# Patient Record
Sex: Male | Born: 1960 | Race: White | Hispanic: No | Marital: Married | Smoking: Never smoker
Health system: Southern US, Community
[De-identification: ages and names within clinical notes are randomized; demographics above are authoritative.]

## PROBLEM LIST (undated history)

## (undated) DIAGNOSIS — R42 Dizziness and giddiness: Secondary | ICD-10-CM

## (undated) DIAGNOSIS — Z972 Presence of dental prosthetic device (complete) (partial): Secondary | ICD-10-CM

## (undated) DIAGNOSIS — I10 Essential (primary) hypertension: Secondary | ICD-10-CM

## (undated) DIAGNOSIS — Z87442 Personal history of urinary calculi: Secondary | ICD-10-CM

## (undated) HISTORY — PX: KIDNEY STONE SURGERY: SHX686

## (undated) HISTORY — PX: HERNIA REPAIR: SHX51

## (undated) HISTORY — PX: BACK SURGERY: SHX140

## (undated) HISTORY — PX: KNEE SURGERY: SHX244

---

## 2006-08-03 ENCOUNTER — Ambulatory Visit: Payer: Self-pay | Admitting: Urology

## 2009-06-13 ENCOUNTER — Ambulatory Visit: Payer: Self-pay | Admitting: Urology

## 2010-08-18 ENCOUNTER — Ambulatory Visit: Payer: Self-pay | Admitting: Internal Medicine

## 2010-10-10 ENCOUNTER — Ambulatory Visit: Payer: Self-pay | Admitting: Internal Medicine

## 2013-06-19 ENCOUNTER — Ambulatory Visit: Payer: Self-pay | Admitting: Unknown Physician Specialty

## 2016-04-14 ENCOUNTER — Other Ambulatory Visit: Payer: Self-pay | Admitting: Neurology

## 2016-04-14 DIAGNOSIS — R251 Tremor, unspecified: Secondary | ICD-10-CM

## 2016-04-25 ENCOUNTER — Ambulatory Visit
Admission: RE | Admit: 2016-04-25 | Discharge: 2016-04-25 | Disposition: A | Discharge status: Home / Self Care | Payer: BLUE CROSS/BLUE SHIELD | Source: Ambulatory Visit | Attending: Neurology | Admitting: Neurology

## 2016-04-25 DIAGNOSIS — G250 Essential tremor: Secondary | ICD-10-CM | POA: Insufficient documentation

## 2016-04-25 DIAGNOSIS — R251 Tremor, unspecified: Secondary | ICD-10-CM

## 2018-01-04 IMAGING — MR MR HEAD W/O CM
10 series · 48 of 48 positions shown · non-contrast
Comparison: 10/10/2010 MRI brain.

CLINICAL DATA: Tremors in both hands and arms, worse on the RIGHT.
Started 5 years ago.

EXAM:
MRI HEAD WITHOUT CONTRAST
TECHNIQUE: Multiplanar, multiecho pulse sequences of the brain and surrounding
structures were obtained without intravenous contrast.

[Series 2: T1 · sagittal · 5.0mm · 0.45mm/px · 3 of 27 slices shown (1 of 2)]
[im 1/27]
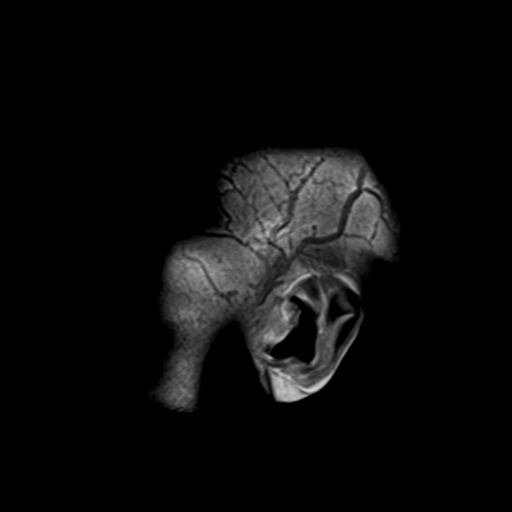
[im 14/27]
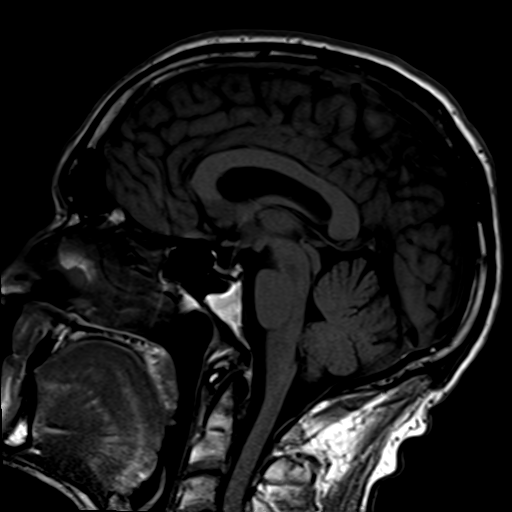
[im 27/27]
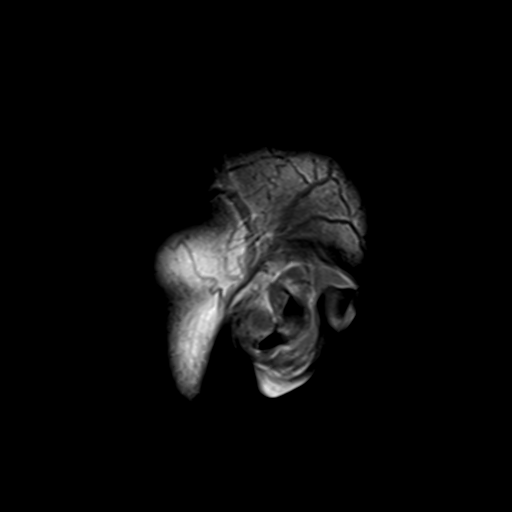

[Series 4: DWI · axial · 3.0mm · 1.80mm/px · z∈[-7,+147]mm · 7 of 55 slices shown (1 of 2)]
[im 1/55]
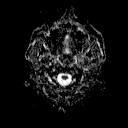
[im 10/55]
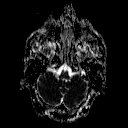
[im 19/55]
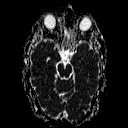
[im 28/55]
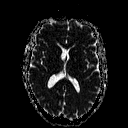
[im 37/55]
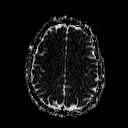
[im 46/55]
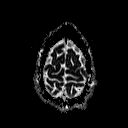
[im 55/55]
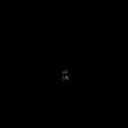

[Series 6: DWI · coronal · 3.0mm · 1.80mm/px · 6 of 47 slices shown (2 of 2)]
[im 1/47]
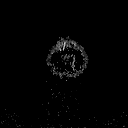
[im 10/47]
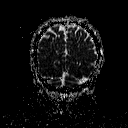
[im 19/47]
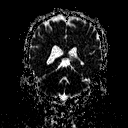
[im 28/47]
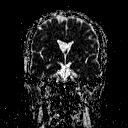
[im 37/47]
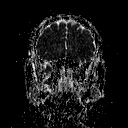
[im 47/47]
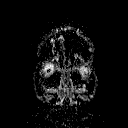

[Series 7: T2 · axial · 5.0mm · 0.60mm/px · z∈[-14,+146]mm · 3 of 27 slices shown (1 of 3)]
[im 1/27]
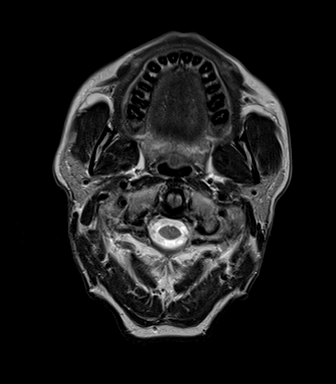
[im 14/27]
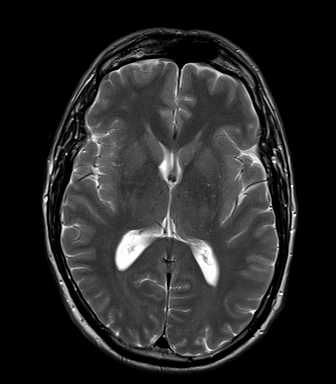
[im 27/27]
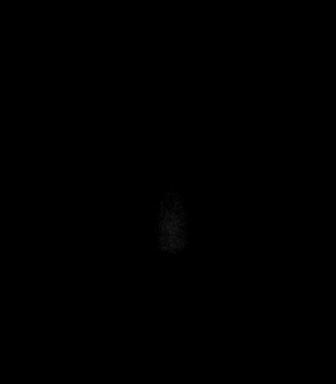

[Series 8: FLAIR · axial · 5.0mm · 0.45mm/px · z∈[-14,+146]mm · 3 of 27 slices shown]
[im 1/27]
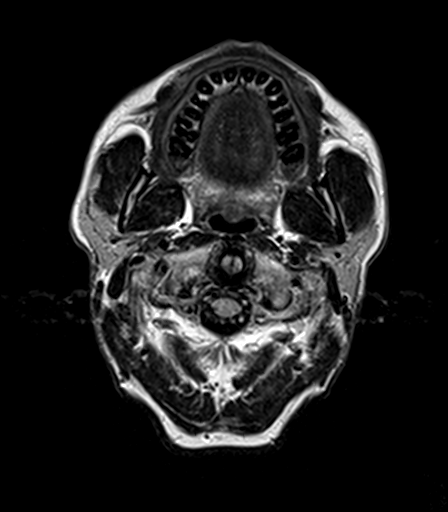
[im 14/27]
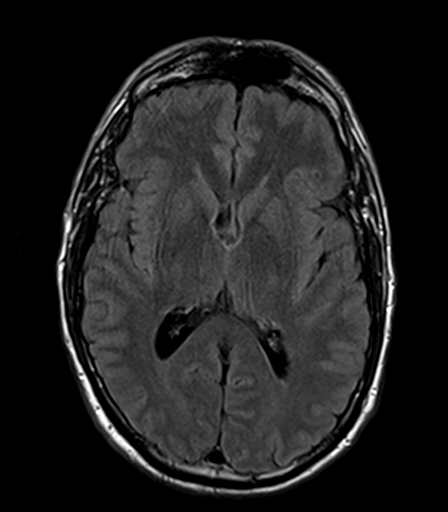
[im 27/27]
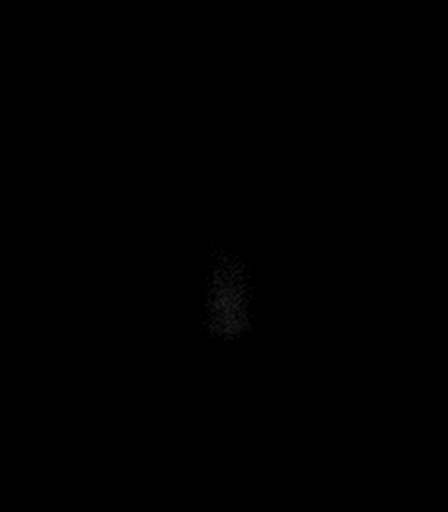

[Series 9: T2 · axial · 5.0mm · 0.45mm/px · z∈[-14,+146]mm · 3 of 27 slices shown (2 of 3)]
[im 1/27]
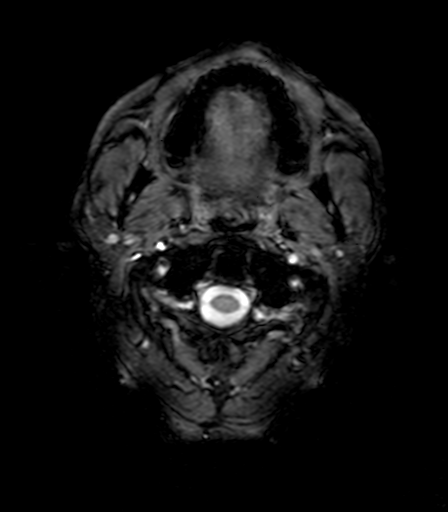
[im 14/27]
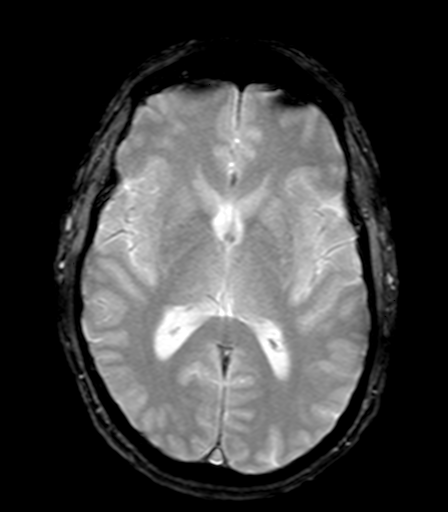
[im 27/27]
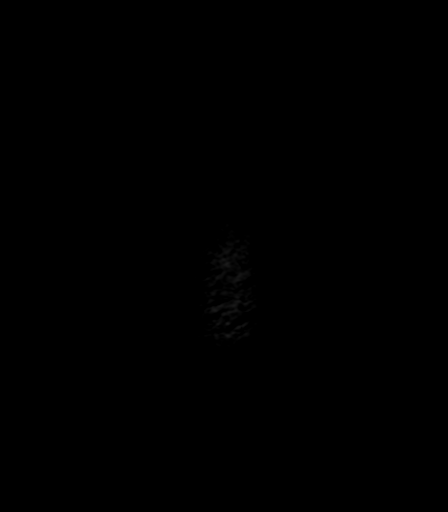

[Series 10: T1 · axial · 3.0mm · 1.00mm/px · z∈[-14,+154]mm · 7 of 60 slices shown (2 of 2)]
[im 1/60]
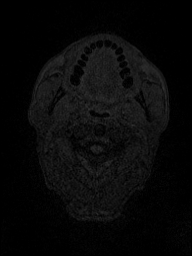
[im 10/60]
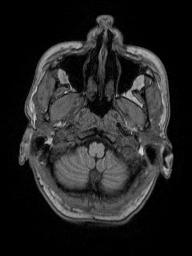
[im 20/60]
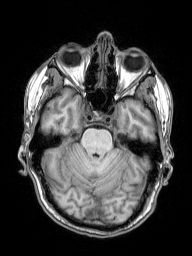
[im 30/60]
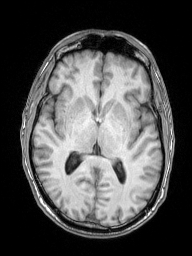
[im 40/60]
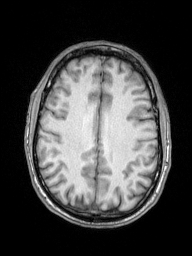
[im 50/60]
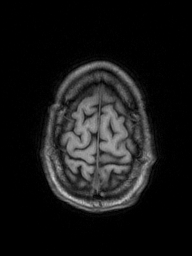
[im 60/60]
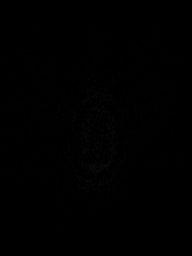

[Series 11: T2 · coronal · 5.0mm · 0.49mm/px · 3 of 29 slices shown (3 of 3)]
[im 1/29]
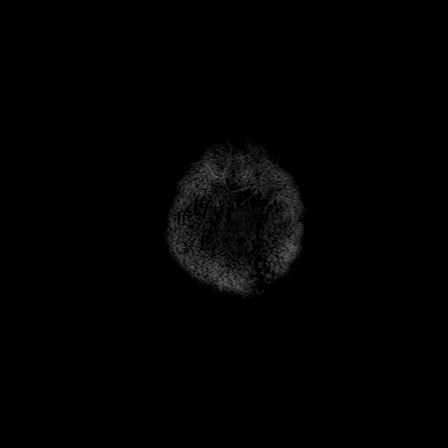
[im 15/29]
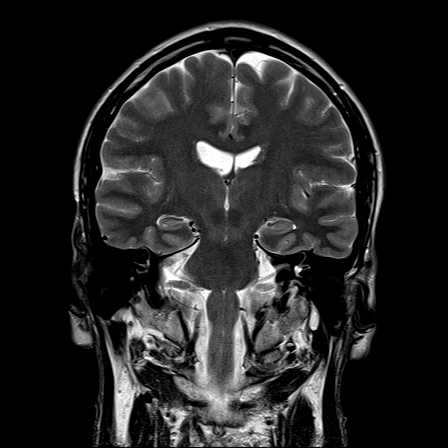
[im 29/29]
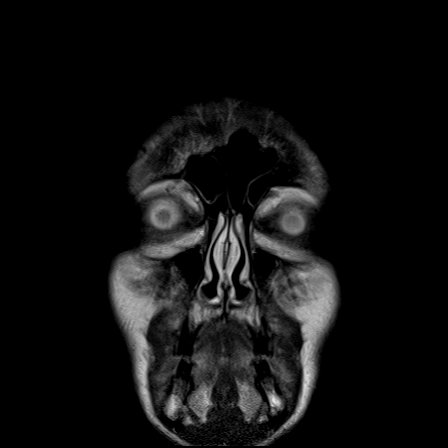

[Series 100: (id) · axial · 3.0mm · 1.80mm/px · z∈[-7,+147]mm · 7 of 55 slices shown]
[im 1/55]
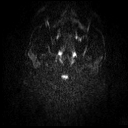
[im 10/55]
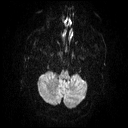
[im 19/55]
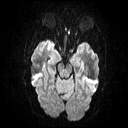
[im 28/55]
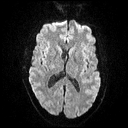
[im 37/55]
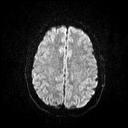
[im 46/55]
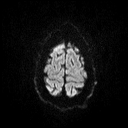
[im 55/55]
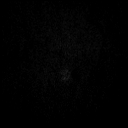

[Series 101: (id) cor · coronal · 3.0mm · 1.80mm/px · 6 of 47 slices shown]
[im 1/47]
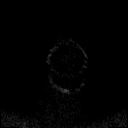
[im 10/47]
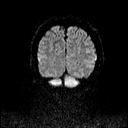
[im 19/47]
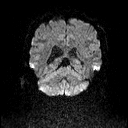
[im 28/47]
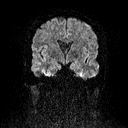
[im 37/47]
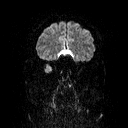
[im 47/47]
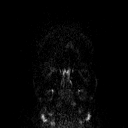

[48 of 48 positions shown; findings below may reference images not displayed]

FINDINGS: No evidence for acute infarction, hemorrhage, mass lesion,
hydrocephalus, or extra-axial fluid. Normal cerebral volume. No
white matter disease.

No midline abnormalities.

Flow voids are maintained throughout the carotid, basilar, and
vertebral arteries. There are no areas of chronic hemorrhage.

Visualized calvarium, skull base, and upper cervical osseous
structures unremarkable. Scalp and extracranial soft tissues,
orbits, sinuses, and mastoids show no acute process.

When compared with the previous study 1421, which was performed
without with contrast, abnormal pachymeningeal enhancement was
present previously. This is nonspecific, but could represent
intracranial hypotension, or a previous infectious/inflammatory
process. The dura is not well visualized on noncontrast imaging
today, but if further investigation desired, consider post infusion
imaging.
IMPRESSION: No acute intracranial findings. No lesion is observed correlate with
patient's symptoms.

Previous post infusion scan from 1421 demonstrated abnormal
pachymeningeal enhancement. Consider follow-up imaging to assess for
interval resolution.

## 2021-07-07 DIAGNOSIS — Z23 Encounter for immunization: Secondary | ICD-10-CM

## 2022-07-13 ENCOUNTER — Ambulatory Visit (LOCAL_COMMUNITY_HEALTH_CENTER): Payer: BLUE CROSS/BLUE SHIELD

## 2022-07-13 DIAGNOSIS — Z719 Counseling, unspecified: Secondary | ICD-10-CM

## 2022-07-13 DIAGNOSIS — Z23 Encounter for immunization: Secondary | ICD-10-CM

## 2022-07-13 NOTE — Progress Notes (Signed)
  Are you feeling sick today? No   Have you ever received a dose of COVID-19 Vaccine? [employer], [place], [place], [place], Other) Yes  If yes, which vaccine and how many doses?   PFIZER, 5   Did you bring the vaccination record card or other documentation?  No   Do you have a health condition or are undergoing treatment that makes you moderately or severely immunocompromised? This would include, but not be limited to: cancer, HIV, organ transplant, immunosuppressive therapy/high-dose corticosteroids, or moderate/severe primary immunodeficiency.  No  Have you received COVID-19 vaccine before or during hematopoietic cell transplant (HCT) or CAR-T-cell therapies? No  Have you ever had an allergic reaction to: (This would include a severe allergic reaction or a reaction that caused hives, swelling, or respiratory distress, including wheezing.) A component of a COVID-19 vaccine or a previous dose of COVID-19 vaccine? No   Have you ever had an allergic reaction to another vaccine (other thanCOVID-19 vaccine) or an injectable medication? (This would include a severe allergic reaction or a reaction that caused hives, swelling, or respiratory distress, including wheezing.)   No    Do you have a history of any of the following:  Myocarditis or Pericarditis No  Dermal fillers:  No  Multisystem Inflammatory Syndrome (MIS-C or MIS-A)? No  COVID-19 disease within the past 3 months? No  Vaccinated with monkeypox vaccine in the last 4 weeks? No  Eligible and administered Fluzone and Comirnaty 12+2023-24, monitored, tolerated well. Verbalized understanding of VIS and NCIR. M.J.ones, LPN.

## 2022-08-12 ENCOUNTER — Ambulatory Visit: Payer: Self-pay

## 2022-08-12 ENCOUNTER — Ambulatory Visit
Admission: EM | Admit: 2022-08-12 | Discharge: 2022-08-12 | Disposition: A | Discharge status: Home / Self Care | Payer: BLUE CROSS/BLUE SHIELD

## 2022-08-12 DIAGNOSIS — T148XXA Other injury of unspecified body region, initial encounter: Secondary | ICD-10-CM

## 2022-08-12 NOTE — Discharge Instructions (Addendum)
Turn to urgent care to have sutures removed after 7 days.

## 2022-08-12 NOTE — ED Provider Notes (Signed)
Thomas Shah    CSN: [id number] Arrival date & time: 08/12/22  1513      History   Chief Complaint Chief Complaint  Patient presents with   Hand Problem    Splinter in right hand. X1 day    HPI Thomas Shah is a 61 y.o. male.   HPI  Presents to urgent care with complaint of splinter in his right hand.  He states that splinter entered his palm and is sticking out the side.  He tried to remove himself but the splinter broke off.  History reviewed. No pertinent past medical history.  There are no problems to display for this patient.   History reviewed. No pertinent surgical history.     Home Medications    Prior to Admission medications   Medication Sig Start Date End Date Taking? Authorizing Provider  Multiple Vitamin (MULTI-VITAMIN) tablet Take 1 tablet by mouth daily.   Yes [provider]  clobetasol ointment (TEMOVATE) 0.05 % Apply twice daily to raised itchy areas until smooth 11/02/19   [provider]    Family History History reviewed. No pertinent family history.  Social History Social History   Tobacco Use   Smoking status: Never   Smokeless tobacco: Never  Substance Use Topics   Alcohol use: Never   Drug use: Never     Allergies   Patient has no known allergies.   Review of Systems Review of Systems   Physical Exam Triage Vital Signs ED Triage Vitals  Enc Vitals Group     BP 08/12/22 1546 (!) 143/99     Pulse Rate 08/12/22 1546 94     Resp 08/12/22 1546 18     Temp 08/12/22 1546 97.8 F (36.6 C)     Temp Source 08/12/22 1546 Oral     SpO2 08/12/22 1546 98 %     Weight 08/12/22 1544 160 lb (72.6 kg)     Height 08/12/22 1544 5\' 11"  (1.803 m)     Head Circumference --      Peak Flow --      Pain Score 08/12/22 1544 1     Pain Loc --      Pain Edu? --      Excl. in GC? --    No data found.  Updated Vital Signs BP (!) 143/99 (BP Location: Right Arm)   Pulse 94   Temp 97.8 F (36.6 C) (Oral)    Resp 18   Ht 5\' 11"  (1.803 m)   Wt 160 lb (72.6 kg)   SpO2 98%   BMI 22.32 kg/m   Visual Acuity Right Eye Distance:   Left Eye Distance:   Bilateral Distance:    Right Eye Near:   Left Eye Near:    Bilateral Near:     Physical Exam Vitals reviewed.  Constitutional:      Appearance: Normal appearance.  Musculoskeletal:       Hands:  Skin:    General: Skin is warm and dry.  Neurological:     General: No focal deficit present.     Mental Status: He is alert and oriented to person, place, and time.  Psychiatric:        Mood and Affect: Mood normal.        Behavior: Behavior normal.      UC Treatments / Results  Labs (all labs ordered are listed, but only abnormal results are displayed) Labs Reviewed - No data to display  EKG   Radiology  No results found.  Procedures Foreign Body Removal  Date/Time: 08/12/2022 5:56 PM  Performed by: Charma Igo, FNP Authorized by: Charma Igo, FNP   Consent:    Consent obtained:  Verbal   Consent given by:  Patient   Risks, benefits, and alternatives were discussed: yes     Risks discussed:  Bleeding, infection, pain and incomplete removal   Alternatives discussed:  No treatment Universal protocol:    Procedure explained and questions answered to patient or proxy's satisfaction: yes     Relevant documents present and verified: yes     Test results available: no     Imaging studies available: no     Required blood products, implants, devices, and special equipment available: no     Site/side marked: no     Immediately prior to procedure, a time out was called: yes     Patient identity confirmed:  Verbally with patient Location:    Location:  Hand   Hand location:  R palm   Depth:  Intradermal   Tendon involvement:  None Pre-procedure details:    Imaging:  None   Preparation: Patient was prepped and draped in usual sterile fashion   Anesthesia:    Anesthesia method:  Local infiltration   Local  anesthetic:  Lidocaine 1% WITH epi Procedure type:    Procedure complexity:  Simple Procedure details:    Incision length:  3 mm   Localization method:  Probed   Dissection of underlying tissues: no     Bloodless field: no     Removal mechanism:  Hemostat   Foreign bodies recovered:  1   Description:  Wedge shaped splinter, 3 cm's in length, width 2 x 3 on proximal end, 1 x 2 on distal end   Intact foreign body removal: yes   Post-procedure details:    Neurovascular status: intact     Confirmation:  No additional foreign bodies on visualization   Skin closure:  Sutures   Suture size:  3-0   Suture material:  Prolene   Suture technique:  Simple interrupted   Dressing:  Adhesive bandage   Procedure completion:  Tolerated Comments:     Instructed patient to remove sutures after 7 days.  (including critical care time)  Medications Ordered in UC Medications - No data to display  Initial Impression / Assessment and Plan / UC Course  I have reviewed the triage vital signs and the nursing notes.  Pertinent labs & imaging results that were available during my care of the patient were reviewed by me and considered in my medical decision making (see chart for details).   See procedure note   Final Clinical Impressions(s) / UC Diagnoses   Final diagnoses:  None   Discharge Instructions   None    ED Prescriptions   None    PDMP not reviewed this encounter.   Charma Igo, [place] 08/12/22 1759

## 2022-08-12 NOTE — ED Triage Notes (Signed)
Pt states that he has a splinter in his right hand. X1 day

## 2022-11-18 ENCOUNTER — Ambulatory Visit
Admission: RE | Admit: 2022-11-18 | Discharge: 2022-11-18 | Disposition: A | Discharge status: Home / Self Care | Payer: 59 | Source: Ambulatory Visit | Attending: Internal Medicine | Admitting: Internal Medicine

## 2022-11-18 ENCOUNTER — Other Ambulatory Visit: Payer: Self-pay | Admitting: Internal Medicine

## 2022-11-18 DIAGNOSIS — R600 Localized edema: Secondary | ICD-10-CM | POA: Diagnosis not present

## 2022-11-30 DIAGNOSIS — M13861 Other specified arthritis, right knee: Secondary | ICD-10-CM | POA: Diagnosis not present

## 2022-11-30 DIAGNOSIS — M13852 Other specified arthritis, left hip: Secondary | ICD-10-CM | POA: Diagnosis not present

## 2022-11-30 DIAGNOSIS — M25461 Effusion, right knee: Secondary | ICD-10-CM | POA: Diagnosis not present

## 2022-11-30 DIAGNOSIS — M25561 Pain in right knee: Secondary | ICD-10-CM | POA: Diagnosis not present

## 2022-12-07 DIAGNOSIS — M5116 Intervertebral disc disorders with radiculopathy, lumbar region: Secondary | ICD-10-CM | POA: Diagnosis not present

## 2022-12-07 DIAGNOSIS — I10 Essential (primary) hypertension: Secondary | ICD-10-CM | POA: Diagnosis not present

## 2022-12-25 DIAGNOSIS — M1711 Unilateral primary osteoarthritis, right knee: Secondary | ICD-10-CM | POA: Diagnosis not present

## 2023-01-20 ENCOUNTER — Ambulatory Visit
Admission: RE | Admit: 2023-01-20 | Discharge: 2023-01-20 | Disposition: A | Discharge status: Home / Self Care | Payer: 59 | Source: Ambulatory Visit | Attending: Orthopedic Surgery | Admitting: Orthopedic Surgery

## 2023-01-20 ENCOUNTER — Other Ambulatory Visit: Payer: Self-pay | Admitting: Orthopedic Surgery

## 2023-01-20 DIAGNOSIS — M2391 Unspecified internal derangement of right knee: Secondary | ICD-10-CM

## 2023-01-25 ENCOUNTER — Other Ambulatory Visit: Payer: Self-pay | Admitting: Orthopedic Surgery

## 2023-01-25 ENCOUNTER — Encounter: Payer: Self-pay | Admitting: Orthopedic Surgery

## 2023-01-26 ENCOUNTER — Other Ambulatory Visit: Payer: Self-pay | Admitting: Orthopedic Surgery

## 2023-01-26 NOTE — Anesthesia Preprocedure Evaluation (Addendum)
Anesthesia Evaluation  Patient identified by MRN, date of birth, ID band Patient awake    Reviewed: Allergy & Precautions, H&P , NPO status , Patient's Chart, lab work & pertinent test results  Airway Mallampati: II  TM Distance: >3 FB Neck ROM: full    Dental  (+) Implants   Pulmonary neg pulmonary ROS   Pulmonary exam normal        Cardiovascular hypertension, Pt. on medications Normal cardiovascular exam     Neuro/Psych Hx vertigo  negative neurological ROS  negative psych ROS   GI/Hepatic negative GI ROS, Neg liver ROS,,,  Endo/Other  negative endocrine ROS    Renal/GU Hx kidney stones      Musculoskeletal   Abdominal Normal abdominal exam  (+)   Peds  Hematology negative hematology ROS (+)   Anesthesia Other Findings History of kidney stones  Hypertension Dental bridge present  Vertigo    Reproductive/Obstetrics negative OB ROS                              Anesthesia Physical Anesthesia Plan  ASA: 2  Anesthesia Plan: General LMA   Post-op Pain Management: Tylenol PO (pre-op) and Toradol IV (intra-op)   Induction: Intravenous  PONV Risk Score and Plan: Dexamethasone, Ondansetron, Midazolam and Treatment may vary due to age or medical condition  Airway Management Planned: Oral ETT  Additional Equipment:   Intra-op Plan:   Post-operative Plan: Extubation in OR  Informed Consent: I have reviewed the patients History and Physical, chart, labs and discussed the procedure including the risks, benefits and alternatives for the proposed anesthesia with the patient or authorized representative who has indicated his/her understanding and acceptance.     Dental Advisory Given  Plan Discussed with: Anesthesiologist, CRNA and Surgeon  Anesthesia Plan Comments:          Anesthesia Quick Evaluation

## 2023-01-29 ENCOUNTER — Ambulatory Visit: Payer: 59 | Admitting: Anesthesiology

## 2023-01-29 ENCOUNTER — Other Ambulatory Visit: Payer: Self-pay

## 2023-01-29 ENCOUNTER — Encounter: Payer: Self-pay | Admitting: Orthopedic Surgery

## 2023-01-29 ENCOUNTER — Encounter
Admission: RE | Disposition: A | Discharge status: Home / Self Care | Payer: Self-pay | Source: Home / Self Care | Attending: Orthopedic Surgery

## 2023-01-29 ENCOUNTER — Ambulatory Visit
Admission: RE | Admit: 2023-01-29 | Discharge: 2023-01-29 | Disposition: A | Discharge status: Home / Self Care | Payer: 59 | Attending: Orthopedic Surgery | Admitting: Orthopedic Surgery

## 2023-01-29 DIAGNOSIS — M2341 Loose body in knee, right knee: Secondary | ICD-10-CM | POA: Insufficient documentation

## 2023-01-29 DIAGNOSIS — M24661 Ankylosis, right knee: Secondary | ICD-10-CM | POA: Insufficient documentation

## 2023-01-29 DIAGNOSIS — I10 Essential (primary) hypertension: Secondary | ICD-10-CM | POA: Diagnosis not present

## 2023-01-29 DIAGNOSIS — M6751 Plica syndrome, right knee: Secondary | ICD-10-CM | POA: Diagnosis not present

## 2023-01-29 DIAGNOSIS — Z87442 Personal history of urinary calculi: Secondary | ICD-10-CM | POA: Insufficient documentation

## 2023-01-29 HISTORY — DX: Presence of dental prosthetic device (complete) (partial): Z97.2

## 2023-01-29 HISTORY — DX: Essential (primary) hypertension: I10

## 2023-01-29 HISTORY — DX: Personal history of urinary calculi: Z87.442

## 2023-01-29 HISTORY — DX: Dizziness and giddiness: R42

## 2023-01-29 HISTORY — PX: LYSIS OF ADHESION: SHX5961

## 2023-01-29 SURGERY — LAPAROTOMY, FOR LYSIS OF ADHESIONS
Anesthesia: General | Site: Knee | Laterality: Right

## 2023-01-29 MED ORDER — MIDAZOLAM HCL 5 MG/5ML IJ SOLN
INTRAMUSCULAR | Status: DC | PRN
  Administered 2023-01-29: 2 mg via INTRAVENOUS

## 2023-01-29 MED ORDER — LIDOCAINE-EPINEPHRINE 1 %-1:100000 IJ SOLN
INTRAMUSCULAR | Status: DC | PRN
  Administered 2023-01-29: 3 mL via INTRAMUSCULAR
  Administered 2023-01-29: 4 mL via INTRAMUSCULAR

## 2023-01-29 MED ORDER — HYDROCODONE-ACETAMINOPHEN 5-325 MG PO TABS: 1.0000 | ORAL_TABLET | ORAL | 0 refills | Status: DC | PRN

## 2023-01-29 MED ORDER — LACTATED RINGERS IV SOLN: INTRAVENOUS | Status: DC

## 2023-01-29 MED ORDER — DEXAMETHASONE SODIUM PHOSPHATE 4 MG/ML IJ SOLN
INTRAMUSCULAR | Status: DC | PRN
  Administered 2023-01-29: 4 mg via INTRAVENOUS

## 2023-01-29 MED ORDER — ACETAMINOPHEN 500 MG PO TABS: 1000.0000 mg | ORAL_TABLET | Freq: Three times a day (TID) | ORAL | 2 refills | Status: DC

## 2023-01-29 MED ORDER — TRIAMCINOLONE ACETONIDE 40 MG/ML IJ SUSP
INTRAMUSCULAR | Status: DC | PRN
  Administered 2023-01-29: 40 mg via INTRA_ARTICULAR

## 2023-01-29 MED ORDER — ASPIRIN 325 MG PO TBEC: 325.0000 mg | DELAYED_RELEASE_TABLET | Freq: Every day | ORAL | 0 refills | Status: AC

## 2023-01-29 MED ORDER — IBUPROFEN 200 MG PO TABS: 200.0000 mg | ORAL_TABLET | Freq: Four times a day (QID) | ORAL | Status: DC | PRN

## 2023-01-29 MED ORDER — KETOROLAC TROMETHAMINE 15 MG/ML IJ SOLN
15.0000 mg | Freq: Once | INTRAMUSCULAR | Status: AC
  Administered 2023-01-29: 15 mg via INTRAVENOUS

## 2023-01-29 MED ORDER — OXYCODONE HCL 5 MG PO TABS
5.0000 mg | ORAL_TABLET | Freq: Once | ORAL | Status: AC | PRN
  Administered 2023-01-29: 5 mg via ORAL

## 2023-01-29 MED ORDER — ONDANSETRON HCL 4 MG/2ML IJ SOLN
INTRAMUSCULAR | Status: DC | PRN
  Administered 2023-01-29: 4 mg via INTRAVENOUS

## 2023-01-29 MED ORDER — DEXMEDETOMIDINE HCL IN NACL 200 MCG/50ML IV SOLN
INTRAVENOUS | Status: DC | PRN
  Administered 2023-01-29 (×5): 4 ug via INTRAVENOUS

## 2023-01-29 MED ORDER — ACETAMINOPHEN 10 MG/ML IV SOLN
1000.0000 mg | Freq: Once | INTRAVENOUS | Status: AC
  Administered 2023-01-29: 1000 mg via INTRAVENOUS

## 2023-01-29 MED ORDER — FENTANYL CITRATE PF 50 MCG/ML IJ SOSY
25.0000 ug | PREFILLED_SYRINGE | INTRAMUSCULAR | Status: DC | PRN
  Administered 2023-01-29: 25 ug via INTRAVENOUS
  Administered 2023-01-29: 50 ug via INTRAVENOUS

## 2023-01-29 MED ORDER — IBUPROFEN 100 MG/5ML PO SUSP: 200.0000 mg | Freq: Four times a day (QID) | ORAL | Status: DC | PRN

## 2023-01-29 MED ORDER — PROPOFOL 10 MG/ML IV BOLUS
INTRAVENOUS | Status: DC | PRN
  Administered 2023-01-29: 150 mg via INTRAVENOUS

## 2023-01-29 MED ORDER — CEFAZOLIN SODIUM-DEXTROSE 2-4 GM/100ML-% IV SOLN
2.0000 g | INTRAVENOUS | Status: AC
  Administered 2023-01-29: 2 g via INTRAVENOUS

## 2023-01-29 MED ORDER — FENTANYL CITRATE (PF) 100 MCG/2ML IJ SOLN
INTRAMUSCULAR | Status: DC | PRN
  Administered 2023-01-29 (×3): 50 ug via INTRAVENOUS

## 2023-01-29 MED ORDER — LACTATED RINGERS IR SOLN
Status: DC | PRN
  Administered 2023-01-29: 12000 mL

## 2023-01-29 MED ORDER — LIDOCAINE HCL (CARDIAC) PF 100 MG/5ML IV SOSY
PREFILLED_SYRINGE | INTRAVENOUS | Status: DC | PRN
  Administered 2023-01-29: 80 mg via INTRATRACHEAL

## 2023-01-29 MED ORDER — OXYCODONE HCL 5 MG/5ML PO SOLN: 5.0000 mg | Freq: Once | ORAL | Status: AC | PRN

## 2023-01-29 MED ORDER — ONDANSETRON 4 MG PO TBDP: 4.0000 mg | ORAL_TABLET | Freq: Three times a day (TID) | ORAL | 0 refills | Status: DC | PRN

## 2023-01-29 SURGICAL SUPPLY — 37 items
ADPR IRR PORT MULTIBAG TUBE (MISCELLANEOUS) ×2
APL PRP STRL LF DISP 70% ISPRP (MISCELLANEOUS) ×1
BLADE SHAVER 4.5X7 STR FR (MISCELLANEOUS) ×1 IMPLANT
BLADE SURG SZ11 CARB STEEL (BLADE) ×1 IMPLANT
BNDG ADH 5X4 AIR PERM ELC (GAUZE/BANDAGES/DRESSINGS) ×1
BNDG COHESIVE 4X5 WHT NS (GAUZE/BANDAGES/DRESSINGS) ×1 IMPLANT
CHLORAPREP W/TINT 26 (MISCELLANEOUS) ×1 IMPLANT
COOLER POLAR GLACIER W/PUMP (MISCELLANEOUS) ×1 IMPLANT
COVER LIGHT HANDLE UNIVERSAL (MISCELLANEOUS) ×2 IMPLANT
CUFF TOURN SGL QUICK 30 (TOURNIQUET CUFF)
CUFF TRNQT CYL 30X4X21-28X (TOURNIQUET CUFF) IMPLANT
DRAPE EXTREMITY T 121X128X90 (DISPOSABLE) ×1 IMPLANT
DRAPE IMP U-DRAPE 54X76 (DRAPES) ×1 IMPLANT
GAUZE SPONGE 4X4 12PLY STRL (GAUZE/BANDAGES/DRESSINGS) ×1 IMPLANT
GLOVE SURG ENC MOIS LTX SZ7.5 (GLOVE) ×1 IMPLANT
GLOVE SURG UNDER LTX SZ8 (GLOVE) ×1 IMPLANT
GOWN STRL REIN 2XL XLG LVL4 (GOWN DISPOSABLE) ×1 IMPLANT
GOWN STRL REUS W/TWL LRG LVL3 (GOWN DISPOSABLE) ×1 IMPLANT
IV LACTATED RINGER IRRG 3000ML (IV SOLUTION) ×4
IV LR IRRIG 3000ML ARTHROMATIC (IV SOLUTION) ×4 IMPLANT
KIT TURNOVER KIT A (KITS) ×1 IMPLANT
MANIFOLD NEPTUNE II (INSTRUMENTS) ×1 IMPLANT
MAT ABSORB  FLUID 56X50 GRAY (MISCELLANEOUS) ×1
MAT ABSORB FLUID 56X50 GRAY (MISCELLANEOUS) ×1 IMPLANT
PACK ARTHROSCOPY KNEE (MISCELLANEOUS) ×1 IMPLANT
PAD ABD DERMACEA PRESS 5X9 (GAUZE/BANDAGES/DRESSINGS) ×1 IMPLANT
PAD WRAPON POLAR KNEE (MISCELLANEOUS) ×1 IMPLANT
PADDING CAST BLEND 6X4 STRL (MISCELLANEOUS) ×1 IMPLANT
SET Y ADAPTER MULIT-BAG IRRIG (MISCELLANEOUS) ×2 IMPLANT
SUT ETHILON 3-0 FS-10 30 BLK (SUTURE) ×1
SUTURE EHLN 3-0 FS-10 30 BLK (SUTURE) ×1 IMPLANT
SYR 5ML LL (SYRINGE) ×1 IMPLANT
TOWEL OR 17X26 4PK STRL BLUE (TOWEL DISPOSABLE) ×2 IMPLANT
TUBING INFLOW SET DBFLO PUMP (TUBING) ×1 IMPLANT
TUBING OUTFLOW SET DBLFO PUMP (TUBING) ×1 IMPLANT
WAND WEREWOLF FLOW 90D (MISCELLANEOUS) ×1 IMPLANT
WRAPON POLAR PAD KNEE (MISCELLANEOUS) ×1

## 2023-01-29 NOTE — Transfer of Care (Signed)
Immediate Anesthesia Transfer of Care Note  Patient: Thomas Shah  Procedure(s) Performed: Right knee arthroscopic lysis of adhesions, partial synovectomy, and manipulation under anesthesia with corticosteroid injection (Right: Knee)  Patient Location: PACU  Anesthesia Type: General LMA  Level of Consciousness: awake, alert  and patient cooperative  Airway and Oxygen Therapy: Patient Spontanous Breathing and Patient connected to supplemental oxygen  Post-op Assessment: Post-op Vital signs reviewed, Patient's Cardiovascular Status Stable, Respiratory Function Stable, Patent Airway and No signs of Nausea or vomiting  Post-op Vital Signs: Reviewed and stable  Complications: No notable events documented.

## 2023-01-29 NOTE — Discharge Instructions (Addendum)
Arthroscopic Knee Surgery   Post-Op Instructions   1. Bracing or crutches: Crutches will be provided at the time of discharge from the surgery center if you do not already have them.   2. Ice: You may be provided with a device [facility]) that allows you to ice the affected area effectively. Otherwise you can ice manually.    3. Driving:  Plan on not driving for at least one week. Please note that you are advised NOT to drive while taking narcotic pain medications as you may be impaired and unsafe to drive.   4. Activity: Ankle pumps several times an hour while awake to prevent blood clots. Weight bearing: as tolerated. Use crutches for as needed (usually ~1 week or less) until pain allows you to ambulate without a limp. Bending and straightening the knee is unlimited. Elevate knee above heart level as much as possible for one week. Avoid standing more than 5 minutes (consecutively) for the first week.  Avoid long distance travel for 2 weeks.  - Perform heel slides with towel starting the day after surgery to maximum amount of knee flexion (knee bending) for 5-10 minutes multiple times throughout the day to avoid stiffness. - Resting position should be leg straight with pillow/bump under heel so nothing is under the knee. You can work on tightening the quadriceps muscle and holding for 10-15s multiple times throughout the day.    5. Medications:  - You have been provided a prescription for narcotic pain medicine. After surgery, take 1-2 narcotic tablets every 4 hours if needed for severe pain.  - You may take up to 3000mg /day of tylenol (acetaminophen). You can take 1000mg  3x/day. Please check your narcotic. If you have acetaminophen in your narcotic (each tablet will be 325mg ), be careful not to exceed a total of 3000mg /day of acetaminophen.  - A prescription for anti-nausea medication will be provided in case the narcotic medicine causes nausea - take 1 tablet every 6 hours only if nauseated.   -Resume Voltaren as an anti-inflammatory postoperatively to help with swelling and pain - Take enteric coated aspirin 325 mg once daily for 2 weeks to prevent blood clots.   6. Bandages: The physical therapist should change the bandages at the first post-op appointment. If needed, the dressing supplies have been provided to you.   7. Physical Therapy: Start Physical therapy approximately 3-4 days after surgery.  They will remove the dressings.  You may shower after this.   8. Work/School: May return to full work usually around 1-2 weeks.   9. Post-Op Appointments: Your first post-op appointment will be in approximately 1-2 weeks.

## 2023-01-29 NOTE — Op Note (Signed)
Operative Note    SURGERY DATE: 01/29/2023   PRE-OP DIAGNOSIS:  1. Right knee arthrofibrosis 2.  Right knee medial plica band 3.  Right knee anterior tibia mass   POST-OP DIAGNOSIS:  1. Right knee arthrofibrosis 2.  Right knee medial plica band 3.  Right knee anterior tibia mass   PROCEDURES:  1. Right knee arthroscopy, lysis of adhesions, and partial synovectomy 2.  Right knee loose body and mass excision 3. Right knee manipulation under anesthesia 4.  Right knee arthroscopic chondroplasty of the patella and medial femoral condyle  5.  Right knee corticosteroid injection   SURGEON: Rosealee Albee, MD   ANESTHESIA: Gen   SPECIMEN: Loose tissue about infrapatellar fat pad Firm, rubbery nodular lesion anterior to the ACL and tibia  ESTIMATED BLOOD LOSS: minimal   TOTAL IV FLUIDS: per anesthesia   INDICATION(S):  Clarance Thaggard is a 62 y.o. male with a 26-month history of sudden knee pain and progressive loss of motion.  Prior to this time, he did not have any significant dysfunction of the knee.  He has had a history of prior Partial medial meniscectomy approximately 20 years ago.  Clinical exam was concerning for significant arthrofibrosis with loss of full flow and extension of the knee.  MRI was suggestive of anterior tibial mass with significant synovitis about the infrapatellar fat pad and intercondylar notch.  There was also concern for prominent medial plica band.  After discussion of risks, benefits, and alternatives to surgery, the patient elected to proceed.   OPERATIVE FINDINGS:    Examination under anesthesia: A careful examination under anesthesia was performed.  Passive range of motion was: Hyperextension: 0.  Extension: 5 degrees short of full extension.  Flexion: 110.  Lachman: normal. Pivot Shift: normal.  Posterior drawer: normal.  Varus stability in full extension: normal.  Varus stability in 30 degrees of flexion: normal.  Valgus stability in full extension:  normal.  Valgus stability in 30 degrees of flexion: normal.   Intra-operative findings: A thorough arthroscopic examination of the knee was performed.  The findings are: 1. Suprapatellar pouch: Synovitic with mild adhesions 2. Undersurface of median ridge: Grade 2-3 degenerative changes 3. Medial patellar facet: Grade 1 degenerative changes with cartilage softening 4. Lateral patellar facet: Grade 1 degenerative changes with cartilage softening 5. Trochlea: Grade 2 degenerative changes centrally 6. Lateral gutter/popliteus tendon: Normal except significant scar formation 7. Hoffa's fat pad: Inflamed and thickened with multiple small nodules attached with a few loose bodies 8. Medial gutter/plica: Significant scar formation with prominent medial plica band 9. ACL: Intact ACL; significant nodular tissue, similar to that about the infrapatellar fat pad, located anteriorly to the ACL in the intercondylar notch.  Furthermore there was a firm, yellowish nodular lesion anterior to the tibia and posterior to the intrameniscal ligament at the base of the ACL insertion on the tibia 10. PCL: Normal 11. Medial meniscus: Consistent with prior partial meniscectomy involving the posterior horn.  No recurrent tear noted 12. Medial compartment cartilage: Focal areas of grade 3 degenerative change of the medial femoral condyle, grade 1 change to the tibial plateau 13. Lateral meniscus: Normal 14. Lateral compartment cartilage: Small, focal areas of grade 1-2 degenerative changes to the femoral condyle; normal tibial plateau   OPERATIVE REPORT:     I identified Dessa Phi in the pre-operative holding area. I marked the operative knee with my initials. I reviewed the risks and benefits of the proposed surgical intervention and the patient (and/or patient's guardian)  wished to proceed. The patient was transferred to the operative suite and placed in the supine position with all bony prominences padded.   Anesthesia was administered. Appropriate IV antibiotics were administered prior to incision. The extremity was then prepped and draped in standard fashion. A time out was performed confirming the correct extremity, correct patient, and correct procedure.   Standard arthroscopy portals were marked. Local anesthetic was injected to the planned portal sites. The anterolateral portal was established with an 11 blade.      The arthroscope was placed in the anterolateral portal and then into the suprapatellar pouch.  A diagnostic knee scope was completed with the above findings.    Next the medial portal was established under needle localization.  First, the loose tissue located anterior to the ACL and posterior to the patellar tendon was easily identified and removed using an arthroscopic grasper.  There were some pieces that were relatively loose and others that were attached to the fat pad.  These were sent for pathology as the first specimen.  Next, the scar tissue posterior to the patellar tendon was identified and an interval was created with the patellar tendon anterior and the scar tissue posterior.  The scar tissue was debrided using an oscillating shaver.  Next, the intrameniscal ligament was identified.  There was a notable, large nodular lesion located posterior to the inner meniscal ligament and anterior to the ACL.  This was dissected free using a small arthroscopic biter.  It was removed using an arthroscopic grasper after enlarging the anteromedial portal.  This was sent as a second pathology specimen.  The interval between the capsule and deep scar was established bluntly with a shaver starting at the level of the anteromedial portal. Thickened synovium/scar tissue between the shaver and condyle was excised. This interval was carried proximally to the level of the VMO.  The prominent medial plica band was also excised using combination of an ArthroCare wand and oscillating shaver.  The arthroscope  was then moved to the anteromedial portal. The interval between capsule and vastus lateralis was established bluntly with the shaver. Thickened synovium and scar tissue between the condyle and shaver was excised. This was also carried proximally to the suprapatellar pouch. Next, the scar tissue and adhesions in the suprapatellar pouch were debrided until a normal volume was re-established. Care was taken to coagulate all major sources of bleeding throughout the arthroscopy, and a final check was performed with a low-pressure setting on the pump to ensure no major sources of bleeding were left.  A chondroplasty of the patella, trochlea, and medial femoral condyle was performed using an oscillating shaver such that there were stable cartilaginous edges.  The joint was then drained of fluid.   A gentle manipulation under anesthesia was then performed. Post-manipulation range of motion was:   Hyperextension: 1.  Extension: 0.  Flexion: 130.    The portals were closed with 3-0 Nylon suture.  A corticosteroid injection consisting of 2 mL 1% lidocaine, 2 mL 0.5% bupivacaine, and 1 mL 40 mg Kenalog was injected into the knee joint.  Sterile dressings included Xeroform, 4x4s, Sof-Rol, and Bias wrap. A Polarcare was placed.  The patient was then awakened and taken to the PACU hemodynamically stable without complication.     POSTOPERATIVE PLAN: The patient will be discharged home today once they meet PACU criteria. Aspirin 325 mg daily was prescribed for 2 weeks for DVT prophylaxis.  Physical therapy will start on POD#3-4.  Weight-bearing as tolerated. Follow  up as outpatient as scheduled.

## 2023-01-29 NOTE — H&P (Signed)
Paper H&P to be scanned into permanent record. H&P reviewed. No significant changes noted.  

## 2023-02-01 ENCOUNTER — Encounter: Payer: Self-pay | Admitting: Orthopedic Surgery

## 2023-02-01 NOTE — Anesthesia Postprocedure Evaluation (Signed)
Anesthesia Post Note  Patient: Demoni Haenel  Procedure(s) Performed: Right knee arthroscopic lysis of adhesions, partial synovectomy, and manipulation under anesthesia with corticosteroid injection (Right: Knee)  Patient location during evaluation: PACU Anesthesia Type: General Level of consciousness: awake and alert Pain management: pain level controlled Vital Signs Assessment: post-procedure vital signs reviewed and stable Respiratory status: spontaneous breathing, nonlabored ventilation and respiratory function stable Cardiovascular status: blood pressure returned to baseline and stable Postop Assessment: no apparent nausea or vomiting Anesthetic complications: no   No notable events documented.   Last Vitals:  Vitals:   01/29/23 1254 01/29/23 1315  BP: 128/74 139/66  Pulse: 90 86  Resp: 14 11  Temp: 36.6 C 36.6 C  SpO2: 99% 97%    Last Pain:  Vitals:   01/29/23 1315  TempSrc:   PainSc: Asleep                 Foye Deer

## 2023-02-02 LAB — SURGICAL PATHOLOGY

## 2023-04-12 ENCOUNTER — Other Ambulatory Visit: Payer: Self-pay

## 2023-04-12 DIAGNOSIS — R319 Hematuria, unspecified: Secondary | ICD-10-CM

## 2023-04-13 ENCOUNTER — Ambulatory Visit (INDEPENDENT_AMBULATORY_CARE_PROVIDER_SITE_OTHER): Payer: Self-pay | Admitting: Urology

## 2023-04-13 ENCOUNTER — Encounter: Payer: Self-pay | Admitting: Urology

## 2023-04-13 VITALS — BP 125/84 | HR 74 | Ht 70.0 in | Wt 164.0 lb

## 2023-04-13 DIAGNOSIS — Z8042 Family history of malignant neoplasm of prostate: Secondary | ICD-10-CM

## 2023-04-13 DIAGNOSIS — Z125 Encounter for screening for malignant neoplasm of prostate: Secondary | ICD-10-CM

## 2023-04-13 DIAGNOSIS — Z8052 Family history of malignant neoplasm of bladder: Secondary | ICD-10-CM

## 2023-04-13 DIAGNOSIS — R3129 Other microscopic hematuria: Secondary | ICD-10-CM

## 2023-04-13 NOTE — Patient Instructions (Signed)

## 2023-04-13 NOTE — Progress Notes (Signed)
   04/13/23 12:12 PM   Thomas Shah 07/30/1961 [id number]  CC: Microscopic hematuria, PSA screening  HPI: 62 year old male referred for asymptomatic microscopic hematuria with 4 RBCs seen on urine sample from June 2024.  He denies any gross hematuria, no significant urinary symptoms.  PSA was normal at 0.28.  He reports a family history of bladder cancer in his grandfather, and prostate cancer in his father when he was in his 72s or 62s.  He denies any flank pain, he does have a history of kidney stones.   PMH: Past Medical History:  Diagnosis Date   Dental bridge present    permanent.  upper, left   History of kidney stones    Hypertension    Vertigo    1 episode.  approx 20 yrs ago    Surgical History: Past Surgical History:  Procedure Laterality Date   BACK SURGERY     HERNIA REPAIR     KIDNEY STONE SURGERY     KNEE SURGERY Right    LYSIS OF ADHESION Right 01/29/2023   Procedure: Right knee arthroscopic lysis of adhesions, partial synovectomy, and manipulation under anesthesia with corticosteroid injection;  Surgeon: Signa Kell, MD;  Location: [facility];  Service: Orthopedics;  Laterality: Right;     Social History:  reports that he has never smoked. He has never been exposed to tobacco smoke. He has never used smokeless tobacco. He reports that he does not drink alcohol and does not use drugs.  Physical Exam: BP 125/84   Pulse 74   Ht 5\' 10"  (1.778 m)   Wt 164 lb (74.4 kg)   BMI 23.53 kg/m    Constitutional:  Alert and oriented, No acute distress. Cardiovascular: No clubbing, cyanosis, or edema. Respiratory: Normal respiratory effort, no increased work of breathing. GI: Abdomen is soft, nontender, nondistended, no abdominal masses   Assessment & Plan:   Healthy 62 year old male with family history of bladder and prostate cancer, microscopic hematuria with 4 RBCs seen on recent urinalysis.  We discussed common possible etiologies of microscopic  hematuria including idiopathic, urolithiasis, medical renal disease, and malignancy. We discussed the new asymptomatic microscopic hematuria guidelines and risk categories of low, intermediate, and high risk that are based on age, risk factors like smoking, and degree of microscopic hematuria. We discussed work-up can range from repeat urinalysis, renal ultrasound and cystoscopy, to CT urogram and cystoscopy.  They fall into the high risk category, and I recommended proceeding with CT urogram and cystoscopy.    Legrand Rams, MD 04/13/2023  [facility] [address] [place], [place] [address] [phone]

## 2023-04-26 ENCOUNTER — Ambulatory Visit
Admission: RE | Admit: 2023-04-26 | Discharge: 2023-04-26 | Disposition: A | Discharge status: Home / Self Care | Payer: 59 | Source: Ambulatory Visit | Attending: Urology | Admitting: Urology

## 2023-04-26 DIAGNOSIS — R3129 Other microscopic hematuria: Secondary | ICD-10-CM | POA: Diagnosis present

## 2023-04-26 LAB — POCT I-STAT CREATININE: Creatinine, Ser: 1.2 mg/dL (ref 0.61–1.24)

## 2023-04-26 MED ORDER — IOHEXOL 300 MG/ML  SOLN
100.0000 mL | Freq: Once | INTRAMUSCULAR | Status: AC | PRN
  Administered 2023-04-26: 100 mL via INTRAVENOUS

## 2023-04-27 ENCOUNTER — Ambulatory Visit: Payer: 59 | Admitting: Urology

## 2023-04-27 ENCOUNTER — Encounter: Payer: Self-pay | Admitting: Urology

## 2023-04-27 ENCOUNTER — Telehealth: Payer: Self-pay | Admitting: Urology

## 2023-04-27 ENCOUNTER — Other Ambulatory Visit
Admission: RE | Admit: 2023-04-27 | Discharge: 2023-04-27 | Disposition: A | Discharge status: Home / Self Care | Payer: 59 | Attending: Urology | Admitting: Urology

## 2023-04-27 VITALS — BP 130/81 | HR 81 | Ht 70.0 in | Wt 164.0 lb

## 2023-04-27 DIAGNOSIS — N200 Calculus of kidney: Secondary | ICD-10-CM | POA: Insufficient documentation

## 2023-04-27 DIAGNOSIS — R3129 Other microscopic hematuria: Secondary | ICD-10-CM

## 2023-04-27 MED ORDER — TAMSULOSIN HCL 0.4 MG PO CAPS
0.4000 mg | ORAL_CAPSULE | Freq: Every day | ORAL | 1 refills | Status: DC | PRN
Start: 2023-04-27 — End: 2023-06-22

## 2023-04-27 MED ORDER — LIDOCAINE HCL URETHRAL/MUCOSAL 2 % EX GEL
1.0000 | Freq: Once | CUTANEOUS | Status: AC
Start: 2023-04-27 — End: 2023-04-27
  Administered 2023-04-27: 1 via URETHRAL

## 2023-04-27 NOTE — Progress Notes (Signed)
Cystoscopy Procedure Note:  Indication: Microscopic hematuria  After informed consent and discussion of the procedure and its risks, Thomas Shah was positioned and prepped in the standard fashion. Cystoscopy was performed with a flexible cystoscope. The urethra, bladder neck and entire bladder was visualized in a standard fashion. The prostate was small. The ureteral orifices were visualized in their normal location and orientation.  Bladder mucosa grossly normal throughout, no abnormalities on retroflexion  Imaging: I personally viewed and interpreted the CT urogram, small right-sided renal stones without hydronephrosis, no other urologic abnormalities, formal radiology read pending  Findings: Normal cystoscopy  ---------------------------------------------------------  Assessment and Plan: We discussed general stone prevention strategies including adequate hydration with goal of producing 2.5 L of urine daily, increasing citric acid intake, increasing calcium intake during high oxalate meals, minimizing animal protein, and decreasing salt intake. Information about dietary recommendations given today.   He recently passed a small kidney stone.  He brought it with him and it is about 3 mm and black stone consistent with calcium oxalate.  PSA normal at 0.28 from June 2024, can continue screening every 2 to 4 years with PCP per guideline recommendations  Will contact with final CT report Prescription of Flomax for future episodes of kidney stones RTC 1 year KUB  Legrand Rams, MD 04/27/2023

## 2023-04-27 NOTE — Telephone Encounter (Signed)
Patient called regarding Cysto scheduled for this afternoon. He said he had CT hematuria work up yesterday, and depending on those results; may not need Cysto? Please call patient to advise. [phone]

## 2023-04-27 NOTE — Telephone Encounter (Signed)
Called pt informed him that hematuria work up consists of both cystoscopy and CT. Advised pt that he can discuss further with Dr. Richardo Hanks at appointment today if he desires. Pt voiced understanding.

## 2023-05-10 ENCOUNTER — Other Ambulatory Visit: Payer: Self-pay | Admitting: Family Medicine

## 2023-05-10 DIAGNOSIS — K7689 Other specified diseases of liver: Secondary | ICD-10-CM

## 2023-05-20 ENCOUNTER — Other Ambulatory Visit: Payer: Self-pay | Admitting: Urology

## 2023-06-01 ENCOUNTER — Ambulatory Visit
Admission: RE | Admit: 2023-06-01 | Discharge: 2023-06-01 | Disposition: A | Discharge status: Home / Self Care | Payer: 59 | Source: Ambulatory Visit | Attending: Family Medicine | Admitting: Family Medicine

## 2023-06-01 DIAGNOSIS — K7689 Other specified diseases of liver: Secondary | ICD-10-CM

## 2023-06-01 MED ORDER — GADOPICLENOL 0.5 MMOL/ML IV SOLN
7.0000 mL | Freq: Once | INTRAVENOUS | Status: AC | PRN
  Administered 2023-06-01: 7 mL via INTRAVENOUS

## 2023-06-22 ENCOUNTER — Other Ambulatory Visit: Payer: Self-pay | Admitting: Urology

## 2023-09-15 ENCOUNTER — Ambulatory Visit: Payer: 59 | Admitting: General Practice

## 2023-09-15 ENCOUNTER — Ambulatory Visit
Admission: RE | Admit: 2023-09-15 | Discharge: 2023-09-15 | Disposition: A | Discharge status: Home / Self Care | Payer: 59 | Attending: Internal Medicine | Admitting: Internal Medicine

## 2023-09-15 ENCOUNTER — Encounter
Admission: RE | Disposition: A | Discharge status: Home / Self Care | Payer: Self-pay | Source: Home / Self Care | Attending: Internal Medicine

## 2023-09-15 ENCOUNTER — Other Ambulatory Visit: Payer: Self-pay

## 2023-09-15 DIAGNOSIS — Z83719 Family history of colon polyps, unspecified: Secondary | ICD-10-CM | POA: Insufficient documentation

## 2023-09-15 DIAGNOSIS — K5730 Diverticulosis of large intestine without perforation or abscess without bleeding: Secondary | ICD-10-CM | POA: Insufficient documentation

## 2023-09-15 DIAGNOSIS — Z1211 Encounter for screening for malignant neoplasm of colon: Secondary | ICD-10-CM | POA: Insufficient documentation

## 2023-09-15 DIAGNOSIS — K640 First degree hemorrhoids: Secondary | ICD-10-CM | POA: Diagnosis not present

## 2023-09-15 HISTORY — PX: COLONOSCOPY WITH PROPOFOL: SHX5780

## 2023-09-15 SURGERY — COLONOSCOPY WITH PROPOFOL
Anesthesia: General

## 2023-09-15 MED ORDER — PROPOFOL 10 MG/ML IV BOLUS
INTRAVENOUS | Status: DC | PRN
  Administered 2023-09-15: 50 mg via INTRAVENOUS

## 2023-09-15 MED ORDER — EPHEDRINE 5 MG/ML INJ
INTRAVENOUS | Status: AC
  Filled 2023-09-15: qty 5

## 2023-09-15 MED ORDER — LIDOCAINE HCL (PF) 2 % IJ SOLN
INTRAMUSCULAR | Status: AC
  Filled 2023-09-15: qty 5

## 2023-09-15 MED ORDER — DEXMEDETOMIDINE HCL IN NACL 80 MCG/20ML IV SOLN
INTRAVENOUS | Status: DC | PRN
Start: 2023-09-15 — End: 2023-09-15
  Administered 2023-09-15: 20 ug via INTRAVENOUS

## 2023-09-15 MED ORDER — SODIUM CHLORIDE 0.9 % IV SOLN: INTRAVENOUS | Status: DC

## 2023-09-15 MED ORDER — PROPOFOL 500 MG/50ML IV EMUL
INTRAVENOUS | Status: DC | PRN
  Administered 2023-09-15: 75 ug/kg/min via INTRAVENOUS

## 2023-09-15 MED ORDER — PROPOFOL 1000 MG/100ML IV EMUL
INTRAVENOUS | Status: AC
  Filled 2023-09-15: qty 100

## 2023-09-15 MED ORDER — LIDOCAINE HCL (CARDIAC) PF 100 MG/5ML IV SOSY
PREFILLED_SYRINGE | INTRAVENOUS | Status: DC | PRN
  Administered 2023-09-15: 60 mg via INTRAVENOUS

## 2023-09-15 NOTE — Interval H&P Note (Signed)
History and Physical Interval Note:  09/15/2023 2:51 PM  Thomas Shah  has presented today for surgery, with the diagnosis of Z86.010 (ICD-10-CM) - History of colon polyps.  The various methods of treatment have been discussed with the patient and family. After consideration of risks, benefits and other options for treatment, the patient has consented to  Procedure(s): COLONOSCOPY WITH PROPOFOL (N/A) as a surgical intervention.  The patient's history has been reviewed, patient examined, no change in status, stable for surgery.  I have reviewed the patient's chart and labs.  Questions were answered to the patient's satisfaction.     [place], [place]

## 2023-09-15 NOTE — Anesthesia Preprocedure Evaluation (Signed)
Anesthesia Evaluation  Patient identified by MRN, date of birth, ID band Patient awake    Reviewed: Allergy & Precautions, NPO status , Patient's Chart, lab work & pertinent test results  Airway Mallampati: III  TM Distance: >3 FB Neck ROM: full    Dental  (+) Chipped, Dental Advidsory Given   Pulmonary neg pulmonary ROS   Pulmonary exam normal        Cardiovascular hypertension, On Medications negative cardio ROS Normal cardiovascular exam     Neuro/Psych negative neurological ROS  negative psych ROS   GI/Hepatic negative GI ROS, Neg liver ROS,,,  Endo/Other  negative endocrine ROS    Renal/GU negative Renal ROS  negative genitourinary   Musculoskeletal   Abdominal   Peds  Hematology negative hematology ROS (+)   Anesthesia Other Findings Past Medical History: No date: Dental bridge present     Comment:  permanent.  upper, left No date: History of kidney stones No date: Hypertension No date: Vertigo     Comment:  1 episode.  approx 20 yrs ago  Past Surgical History: No date: BACK SURGERY No date: HERNIA REPAIR No date: KIDNEY STONE SURGERY No date: KNEE SURGERY; Right 01/29/2023: LYSIS OF ADHESION; Right     Comment:  Procedure: Right knee arthroscopic lysis of adhesions,               partial synovectomy, and manipulation under anesthesia               with corticosteroid injection;  Surgeon: Signa Kell,               MD;  Location: [facility];  Service:               Orthopedics;  Laterality: Right;  BMI    Body Mass Index: 21.98 kg/m      Reproductive/Obstetrics negative OB ROS                             Anesthesia Physical Anesthesia Plan  ASA: 2  Anesthesia Plan: General   Post-op Pain Management: Minimal or no pain anticipated   Induction: Intravenous  PONV Risk Score and Plan: 3 and Propofol infusion, TIVA and Ondansetron  Airway Management  Planned: Nasal Cannula  Additional Equipment: None  Intra-op Plan:   Post-operative Plan:   Informed Consent: I have reviewed the patients History and Physical, chart, labs and discussed the procedure including the risks, benefits and alternatives for the proposed anesthesia with the patient or authorized representative who has indicated his/her understanding and acceptance.     Dental advisory given  Plan Discussed with: CRNA and Surgeon  Anesthesia Plan Comments: (Discussed risks of anesthesia with patient, including possibility of difficulty with spontaneous ventilation under anesthesia necessitating airway intervention, PONV, and rare risks such as cardiac or respiratory or neurological events, and allergic reactions. Discussed the role of CRNA in patient's perioperative care. Patient understands.)       Anesthesia Quick Evaluation

## 2023-09-15 NOTE — H&P (Signed)
Outpatient short stay form Pre-procedure 09/15/2023 2:21 PM Marijayne Rauth K. Norma Fredrickson, M.D.  Primary Physician: Bethann Punches, M.D.  Reason for visit:  Hx of adenomatous colon polyps > 5 yrs ago.  History of present illness:                            Patient presents for colonoscopy for a personal hx of colon polyps. The patient denies abdominal pain, abnormal weight loss or rectal bleeding.      Current Facility-Administered Medications:    0.9 %  sodium chloride infusion, , Intravenous, Continuous, [place], Boykin Nearing, MD, Last Rate: 20 mL/hr at 09/15/23 1324, New Bag at 09/15/23 1324  Medications Prior to Admission  Medication Sig Dispense Refill Last Dose/Taking   losartan-hydrochlorothiazide (HYZAAR) 50-12.5 MG tablet Take 1 tablet by mouth 2 (two) times daily.   09/15/2023 at  7:00 AM   tamsulosin (FLOMAX) 0.4 MG CAPS capsule TAKE 1 CAPSULE (0.4 MG TOTAL) BY MOUTH DAILY AS NEEDED (KIDNEY STONES). 90 capsule 3 Past Month   diclofenac (VOLTAREN) 75 MG EC tablet Take 75 mg by mouth 2 (two) times daily.      Multiple Vitamin (MULTI-VITAMIN) tablet Take 1 tablet by mouth daily.   09/12/2023     No Known Allergies   Past Medical History:  Diagnosis Date   Dental bridge present    permanent.  upper, left   History of kidney stones    Hypertension    Vertigo    1 episode.  approx 20 yrs ago    Review of systems:  Otherwise negative.    Physical Exam  Gen: Alert, oriented. Appears stated age.  HEENT: [place]/AT. PERRLA. Lungs: CTA, no wheezes. CV: RR nl S1, S2. Abd: soft, benign, no masses. BS+ Ext: No edema. Pulses 2+    Planned procedures: Proceed with colonoscopy. The patient understands the nature of the planned procedure, indications, risks, alternatives and potential complications including but not limited to bleeding, infection, perforation, damage to internal organs and possible oversedation/side effects from anesthesia. The patient agrees and gives consent to proceed.   Please refer to procedure notes for findings, recommendations and patient disposition/instructions.     Dajanique Robley K. Norma Fredrickson, M.D. Gastroenterology 09/15/2023  2:21 PM

## 2023-09-15 NOTE — Interval H&P Note (Signed)
History and Physical Interval Note:  09/15/2023 2:23 PM  Thomas Shah  has presented today for surgery, with the diagnosis of Z86.010 (ICD-10-CM) - History of colon polyps.  The various methods of treatment have been discussed with the patient and family. After consideration of risks, benefits and other options for treatment, the patient has consented to  Procedure(s): COLONOSCOPY WITH PROPOFOL (N/A) as a surgical intervention.  The patient's history has been reviewed, patient examined, no change in status, stable for surgery.  I have reviewed the patient's chart and labs.  Questions were answered to the patient's satisfaction.     [place], [place]

## 2023-09-15 NOTE — Op Note (Addendum)
[facility] Patient Name: Thomas Shah Procedure Date: 09/15/2023 2:21 PM MRN: [id number] Account #: [id number] Date of Birth: [birth year] Admit Type: Outpatient Age: 62 Room: [facility] ENDO ROOM 2 Gender: Male Note Status: Supervisor Override Instrument Name: Nelda Marseille [id number] Procedure:             Colonoscopy Indications:           Colon cancer screening in patient at increased risk:                         Family history of 1st-degree relative with colon                         polyps at age 88 years (or older), High risk colon                         cancer surveillance: Personal history of colonic polyps Providers:             Boykin Nearing. Norma Fredrickson MD, MD Referring MD:          Danella Penton, MD (Referring MD) Medicines:             Propofol per Anesthesia Complications:         No immediate complications. Procedure:             Pre-Anesthesia Assessment:                        - The risks and benefits of the procedure and the                         sedation options and risks were discussed with the                         patient. All questions were answered and informed                         consent was obtained.                        - Patient identification and proposed procedure were                         verified prior to the procedure by the nurse. The                         procedure was verified in the procedure room.                        - ASA Grade Assessment: III - A patient with severe                         systemic disease.                        - After reviewing the risks and benefits, the patient                         was deemed in satisfactory condition to undergo the  procedure.                        After obtaining informed consent, the colonoscope was                         passed under direct vision. Throughout the procedure,                         the patient's blood pressure, pulse,  and oxygen                         saturations were monitored continuously. The                         Colonoscope was introduced through the anus and                         advanced to the the cecum, identified by appendiceal                         orifice and ileocecal valve. The colonoscopy was                         performed without difficulty. The patient tolerated                         the procedure well. The quality of the bowel                         preparation was good. The ileocecal valve, appendiceal                         orifice, and rectum were photographed. Findings:      The perianal and digital rectal examinations were normal. Pertinent       negatives include normal sphincter tone and no palpable rectal lesions.      Non-bleeding internal hemorrhoids were found during retroflexion. The       hemorrhoids were Grade I (internal hemorrhoids that do not prolapse).      A single small-mouthed diverticulum was found in the sigmoid colon.      The exam was otherwise without abnormality. Impression:            - Non-bleeding internal hemorrhoids.                        - Diverticulosis in the sigmoid colon.                        - The examination was otherwise normal.                        - No specimens collected. Recommendation:        - Patient has a contact number available for                         emergencies. The signs and symptoms of potential                         delayed complications were  discussed with the patient.                         Return to normal activities tomorrow. Written                         discharge instructions were provided to the patient.                        - Resume previous diet.                        - Continue present medications.                        - Repeat colonoscopy in 5 years for screening purposes.                        - Return to GI office PRN.                        - The findings and recommendations were  discussed with                         the patient. Procedure Code(s):     --- Professional ---                        Z6109, Colorectal cancer screening; colonoscopy on                         individual at high risk Diagnosis Code(s):     --- Professional ---                        K57.30, Diverticulosis of large intestine without                         perforation or abscess without bleeding                        K64.0, First degree hemorrhoids                        Z83.71, Family history of colonic polyps CPT copyright 2022 American Medical Association. All rights reserved. The codes documented in this report are preliminary and upon coder review may  be revised to meet current compliance requirements. Stanton Kidney MD, MD 09/15/2023 2:53:28 PM This report has been signed electronically. Number of Addenda: 0 Note Initiated On: 09/15/2023 2:21 PM Scope Withdrawal Time: 0 hours 5 minutes 57 seconds  Total Procedure Duration: 0 hours 11 minutes 41 seconds  Estimated Blood Loss:  Estimated blood loss: none. Estimated blood loss: none.      [facility]

## 2023-09-15 NOTE — Transfer of Care (Signed)
Immediate Anesthesia Transfer of Care Note  Patient: Thomas Shah  Procedure(s) Performed: COLONOSCOPY WITH PROPOFOL  Patient Location: PACU  Anesthesia Type:General  Level of Consciousness: sedated  Airway & Oxygen Therapy: Patient Spontanous Breathing  Post-op Assessment: Report given to RN and Post -op Vital signs reviewed and stable  Post vital signs: Reviewed and stable  Last Vitals:  Vitals Value Taken Time  BP 126/78 09/15/23 1450  Temp 36.1 C 09/15/23 1450  Pulse 66 09/15/23 1451  Resp 13 09/15/23 1451  SpO2 100 % 09/15/23 1451  Vitals shown include unfiled device data.  Last Pain:  Vitals:   09/15/23 1450  TempSrc: Temporal  PainSc: 0-No pain         Complications: No notable events documented.

## 2023-09-16 ENCOUNTER — Encounter: Payer: Self-pay | Admitting: Internal Medicine

## 2023-09-16 NOTE — Anesthesia Postprocedure Evaluation (Signed)
Anesthesia Post Note  Patient: Thomas Shah  Procedure(s) Performed: COLONOSCOPY WITH PROPOFOL  Patient location during evaluation: Endoscopy Anesthesia Type: General Level of consciousness: awake and alert Pain management: pain level controlled Vital Signs Assessment: post-procedure vital signs reviewed and stable Respiratory status: spontaneous breathing, nonlabored ventilation, respiratory function stable and patient connected to nasal cannula oxygen Cardiovascular status: blood pressure returned to baseline and stable Postop Assessment: no apparent nausea or vomiting Anesthetic complications: no   No notable events documented.   Last Vitals:  Vitals:   09/15/23 1450 09/15/23 1505  BP: 126/78 118/76  Pulse: 63 63  Resp: 14 13  Temp: (!) 36.1 C   SpO2: 100% 100%    Last Pain:  Vitals:   09/15/23 1505  TempSrc:   PainSc: 0-No pain                 Lenard Simmer

## 2024-04-24 ENCOUNTER — Other Ambulatory Visit: Payer: Self-pay

## 2024-04-24 DIAGNOSIS — N200 Calculus of kidney: Secondary | ICD-10-CM

## 2024-04-26 ENCOUNTER — Ambulatory Visit: Payer: Self-pay | Admitting: Urology

## 2024-04-26 ENCOUNTER — Ambulatory Visit
Admission: RE | Admit: 2024-04-26 | Discharge: 2024-04-26 | Disposition: A | Discharge status: Home / Self Care | Source: Ambulatory Visit | Attending: Urology

## 2024-04-26 ENCOUNTER — Ambulatory Visit
Admission: RE | Admit: 2024-04-26 | Discharge: 2024-04-26 | Disposition: A | Discharge status: Home / Self Care | Attending: Urology | Admitting: Urology

## 2024-04-26 VITALS — BP 116/73 | HR 78 | Ht 71.0 in | Wt 164.6 lb

## 2024-04-26 DIAGNOSIS — Z125 Encounter for screening for malignant neoplasm of prostate: Secondary | ICD-10-CM | POA: Diagnosis not present

## 2024-04-26 DIAGNOSIS — R3121 Asymptomatic microscopic hematuria: Secondary | ICD-10-CM

## 2024-04-26 DIAGNOSIS — N200 Calculus of kidney: Secondary | ICD-10-CM | POA: Diagnosis not present

## 2024-04-26 NOTE — Progress Notes (Signed)
   04/26/2024 4:07 PM   Thomas Shah Jan 05, 1961 [id number]  Reason for visit: Follow up nephrolithiasis, microscopic hematuria, PSA  HPI: 63 year old male with microscopic hematuria with negative workup in July 2024.  He has a history of nephrolithiasis, thinks he passed a small stone about a year ago after our visit.  He takes Flomax  as needed when he is passing a stone.  He denies any gross hematuria.  Recent PSA normal at 0.38, stable from prior, reassurance provided, can continue screening through age 12.  Persistent microscopic hematuria, no further workup needed at this time with recent negative workup 2024.  Personally viewed interpreted KUB today showing stable small right-sided renal stones  We discussed general stone prevention strategies including adequate hydration with goal of producing 2.5 L of urine daily, increasing citric acid intake, increasing calcium intake during high oxalate meals, minimizing animal protein, and decreasing salt intake. Information about dietary recommendations given today.   He would like to continue yearly follow-up for KUB  Thomas JAYSON Burnet, MD  [facility] [address] [place], [place] [address] [phone]

## 2024-04-26 NOTE — Patient Instructions (Signed)
 Thomas Shah

## 2025-04-26 ENCOUNTER — Ambulatory Visit: Admitting: Urology
